# Patient Record
Sex: Male | Born: 1935 | Race: Black or African American | Hispanic: No | Marital: Single
Health system: Southern US, Community
[De-identification: ages and names within clinical notes are randomized; demographics above are authoritative.]

---

## 2011-01-14 ENCOUNTER — Emergency Department: Payer: Self-pay | Admitting: *Deleted

## 2012-04-02 ENCOUNTER — Inpatient Hospital Stay: Payer: Self-pay | Admitting: Internal Medicine

## 2012-04-02 LAB — BASIC METABOLIC PANEL
Anion Gap: 7 (ref 7–16)
BUN: 18 mg/dL (ref 7–18)
Calcium, Total: 8.8 mg/dL (ref 8.5–10.1)
Co2: 27 mmol/L (ref 21–32)
Creatinine: 1.06 mg/dL (ref 0.60–1.30)
EGFR (African American): >60
Glucose: 103 mg/dL — ABNORMAL HIGH (ref 65–99)
Osmolality: 280 (ref 275–301)
Potassium: 3.8 mmol/L (ref 3.5–5.1)
Sodium: 139 mmol/L (ref 136–145)

## 2012-04-02 LAB — TROPONIN I: Troponin-I: 0.06 ng/mL — ABNORMAL HIGH

## 2012-04-02 LAB — PRO B NATRIURETIC PEPTIDE: B-Type Natriuretic Peptide: 4097 pg/mL — ABNORMAL HIGH (ref 0–450)

## 2012-04-02 LAB — CBC
HGB: 14.9 g/dL (ref 13.0–18.0)
MCV: 98 fL (ref 80–100)
Platelet: 185 10*3/uL (ref 150–440)
RDW: 14.3 % (ref 11.5–14.5)

## 2012-04-03 LAB — TROPONIN I
Troponin-I: 0.09 ng/mL — ABNORMAL HIGH
Troponin-I: 0.09 ng/mL — ABNORMAL HIGH

## 2012-04-03 LAB — LIPID PANEL
Cholesterol: 161 mg/dL (ref 0–200)
HDL Cholesterol: 57 mg/dL (ref 40–60)
Ldl Cholesterol, Calc: 83 mg/dL (ref 0–100)
Triglycerides: 106 mg/dL (ref 0–200)

## 2012-04-03 LAB — TSH: Thyroid Stimulating Horm: 1.56 u[IU]/mL

## 2012-04-04 LAB — BASIC METABOLIC PANEL
Anion Gap: 7 (ref 7–16)
Calcium, Total: 8.3 mg/dL — ABNORMAL LOW (ref 8.5–10.1)
Co2: 31 mmol/L (ref 21–32)
EGFR (African American): >60
EGFR (Non-African Amer.): >60
Glucose: 90 mg/dL (ref 65–99)
Potassium: 3.6 mmol/L (ref 3.5–5.1)

## 2013-03-09 ENCOUNTER — Inpatient Hospital Stay: Payer: Self-pay | Admitting: Internal Medicine

## 2013-03-09 LAB — BASIC METABOLIC PANEL
Anion Gap: 7 (ref 7–16)
BUN: 16 mg/dL (ref 7–18)
CHLORIDE: 106 mmol/L (ref 98–107)
CO2: 30 mmol/L (ref 21–32)
Calcium, Total: 8.7 mg/dL (ref 8.5–10.1)
Creatinine: 1.28 mg/dL (ref 0.60–1.30)
EGFR (Non-African Amer.): 53 — ABNORMAL LOW
Glucose: 168 mg/dL — ABNORMAL HIGH (ref 65–99)
Osmolality: 290 (ref 275–301)
POTASSIUM: 3.1 mmol/L — AB (ref 3.5–5.1)
Sodium: 143 mmol/L (ref 136–145)

## 2013-03-09 LAB — PRO B NATRIURETIC PEPTIDE: B-Type Natriuretic Peptide: 9782 pg/mL — ABNORMAL HIGH (ref 0–450)

## 2013-03-09 LAB — CBC
HCT: 40.2 % (ref 40.0–52.0)
HGB: 12.8 g/dL — AB (ref 13.0–18.0)
MCH: 31.4 pg (ref 26.0–34.0)
MCHC: 31.7 g/dL — ABNORMAL LOW (ref 32.0–36.0)
MCV: 99 fL (ref 80–100)
PLATELETS: 148 10*3/uL — AB (ref 150–440)
RBC: 4.07 10*6/uL — ABNORMAL LOW (ref 4.40–5.90)
RDW: 16.6 % — AB (ref 11.5–14.5)
WBC: 5.9 10*3/uL (ref 3.8–10.6)

## 2013-03-09 LAB — TROPONIN I
TROPONIN-I: 0.11 ng/mL — AB
Troponin-I: 0.15 ng/mL — ABNORMAL HIGH

## 2013-03-10 LAB — BASIC METABOLIC PANEL
ANION GAP: 7 (ref 7–16)
BUN: 16 mg/dL (ref 7–18)
Calcium, Total: 8.3 mg/dL — ABNORMAL LOW (ref 8.5–10.1)
Chloride: 104 mmol/L (ref 98–107)
Co2: 33 mmol/L — ABNORMAL HIGH (ref 21–32)
Creatinine: 1.25 mg/dL (ref 0.60–1.30)
GFR CALC NON AF AMER: 55 — AB
Glucose: 101 mg/dL — ABNORMAL HIGH (ref 65–99)
OSMOLALITY: 288 (ref 275–301)
Potassium: 3.2 mmol/L — ABNORMAL LOW (ref 3.5–5.1)
Sodium: 144 mmol/L (ref 136–145)

## 2013-03-10 LAB — CBC WITH DIFFERENTIAL/PLATELET
BASOS ABS: 0 10*3/uL (ref 0.0–0.1)
BASOS PCT: 0.5 %
EOS PCT: 0.9 %
Eosinophil #: 0.1 10*3/uL (ref 0.0–0.7)
HCT: 40.2 % (ref 40.0–52.0)
HGB: 12.7 g/dL — AB (ref 13.0–18.0)
Lymphocyte #: 0.8 10*3/uL — ABNORMAL LOW (ref 1.0–3.6)
Lymphocyte %: 14.1 %
MCH: 31.2 pg (ref 26.0–34.0)
MCHC: 31.7 g/dL — ABNORMAL LOW (ref 32.0–36.0)
MCV: 99 fL (ref 80–100)
Monocyte #: 0.5 x10 3/mm (ref 0.2–1.0)
Monocyte %: 8.2 %
NEUTROS ABS: 4.5 10*3/uL (ref 1.4–6.5)
Neutrophil %: 76.3 %
Platelet: 152 10*3/uL (ref 150–440)
RBC: 4.08 10*6/uL — ABNORMAL LOW (ref 4.40–5.90)
RDW: 16.7 % — AB (ref 11.5–14.5)
WBC: 5.9 10*3/uL (ref 3.8–10.6)

## 2013-03-10 LAB — TROPONIN I: Troponin-I: 0.15 ng/mL — ABNORMAL HIGH

## 2013-03-10 LAB — LIPID PANEL
Cholesterol: 99 mg/dL (ref 0–200)
HDL Cholesterol: 49 mg/dL (ref 40–60)
LDL CHOLESTEROL, CALC: 36 mg/dL (ref 0–100)
Triglycerides: 72 mg/dL (ref 0–200)
VLDL CHOLESTEROL, CALC: 14 mg/dL (ref 5–40)

## 2013-03-10 LAB — OCCULT BLOOD X 1 CARD TO LAB, STOOL: OCCULT BLOOD, FECES: NEGATIVE

## 2013-03-10 LAB — HEMOGLOBIN A1C: HEMOGLOBIN A1C: 5.9 % (ref 4.2–6.3)

## 2013-03-10 LAB — TSH: Thyroid Stimulating Horm: 1.47 u[IU]/mL

## 2013-03-11 LAB — MAGNESIUM: Magnesium: 2 mg/dL

## 2013-03-11 LAB — POTASSIUM: Potassium: 3.4 mmol/L — ABNORMAL LOW (ref 3.5–5.1)

## 2013-08-27 ENCOUNTER — Inpatient Hospital Stay: Payer: Self-pay | Admitting: Internal Medicine

## 2013-08-27 LAB — CBC WITH DIFFERENTIAL/PLATELET
Basophil #: 0 10*3/uL (ref 0.0–0.1)
Basophil %: 0.2 %
Eosinophil #: 0 10*3/uL (ref 0.0–0.7)
Eosinophil %: 0.3 %
HCT: 49.2 % (ref 40.0–52.0)
HGB: 15.9 g/dL (ref 13.0–18.0)
Lymphocyte #: 0.8 10*3/uL — ABNORMAL LOW (ref 1.0–3.6)
Lymphocyte %: 6.6 %
MCH: 32.9 pg (ref 26.0–34.0)
MCHC: 32.3 g/dL (ref 32.0–36.0)
MCV: 102 fL — AB (ref 80–100)
MONO ABS: 0.6 x10 3/mm (ref 0.2–1.0)
MONOS PCT: 4.6 %
Neutrophil #: 11 10*3/uL — ABNORMAL HIGH (ref 1.4–6.5)
Neutrophil %: 88.3 %
Platelet: 167 10*3/uL (ref 150–440)
RBC: 4.82 10*6/uL (ref 4.40–5.90)
RDW: 13.6 % (ref 11.5–14.5)
WBC: 12.4 10*3/uL — ABNORMAL HIGH (ref 3.8–10.6)

## 2013-08-27 LAB — TSH: THYROID STIMULATING HORM: 1.81 u[IU]/mL

## 2013-08-27 LAB — URINALYSIS, COMPLETE
BILIRUBIN, UR: NEGATIVE
Glucose,UR: NEGATIVE mg/dL (ref 0–75)
KETONE: NEGATIVE
NITRITE: NEGATIVE
Ph: 6 (ref 4.5–8.0)
RBC,UR: 15 /HPF (ref 0–5)
Specific Gravity: 1.02 (ref 1.003–1.030)
Squamous Epithelial: NONE SEEN

## 2013-08-27 LAB — TROPONIN I: TROPONIN-I: 0.17 ng/mL — AB

## 2013-08-27 LAB — HEPATIC FUNCTION PANEL A (ARMC)
ALT: 42 U/L
Albumin: 3 g/dL — ABNORMAL LOW (ref 3.4–5.0)
Alkaline Phosphatase: 49 U/L
Bilirubin, Direct: 0.3 mg/dL — ABNORMAL HIGH (ref 0.00–0.20)
Bilirubin,Total: 1.3 mg/dL — ABNORMAL HIGH (ref 0.2–1.0)
SGOT(AST): 89 U/L — ABNORMAL HIGH (ref 15–37)
Total Protein: 7.1 g/dL (ref 6.4–8.2)

## 2013-08-27 LAB — BASIC METABOLIC PANEL
Anion Gap: 10 (ref 7–16)
BUN: 20 mg/dL — ABNORMAL HIGH (ref 7–18)
Calcium, Total: 8.8 mg/dL (ref 8.5–10.1)
Chloride: 110 mmol/L — ABNORMAL HIGH (ref 98–107)
Co2: 22 mmol/L (ref 21–32)
Creatinine: 0.99 mg/dL (ref 0.60–1.30)
EGFR (Non-African Amer.): >60
GLUCOSE: 99 mg/dL (ref 65–99)
Osmolality: 286 (ref 275–301)
Potassium: 5.5 mmol/L — ABNORMAL HIGH (ref 3.5–5.1)
Sodium: 142 mmol/L (ref 136–145)

## 2013-08-27 LAB — CK: CK, Total: 1285 U/L — ABNORMAL HIGH

## 2013-08-28 ENCOUNTER — Ambulatory Visit: Payer: Self-pay | Admitting: Neurology

## 2013-08-28 LAB — BASIC METABOLIC PANEL WITH GFR
Anion Gap: 8
BUN: 23 mg/dL — ABNORMAL HIGH
Calcium, Total: 8.6 mg/dL
Chloride: 111 mmol/L — ABNORMAL HIGH
Co2: 23 mmol/L
Creatinine: 1.29 mg/dL
EGFR (African American): >60
EGFR (Non-African Amer.): 53 — ABNORMAL LOW
Glucose: 78 mg/dL
Osmolality: 286
Potassium: 3.7 mmol/L
Sodium: 142 mmol/L

## 2013-08-28 LAB — CK: CK, Total: 715 U/L — ABNORMAL HIGH

## 2013-08-29 LAB — LIPID PANEL
CHOLESTEROL: 89 mg/dL (ref 0–200)
HDL Cholesterol: 43 mg/dL (ref 40–60)
Ldl Cholesterol, Calc: 35 mg/dL (ref 0–100)
TRIGLYCERIDES: 57 mg/dL (ref 0–200)
VLDL CHOLESTEROL, CALC: 11 mg/dL (ref 5–40)

## 2013-08-29 LAB — CK: CK, TOTAL: 281 U/L

## 2013-08-30 LAB — URINE CULTURE

## 2013-08-31 LAB — CBC WITH DIFFERENTIAL/PLATELET
Basophil #: 0 10*3/uL (ref 0.0–0.1)
Basophil %: 0.5 %
EOS PCT: 1.4 %
Eosinophil #: 0.1 10*3/uL (ref 0.0–0.7)
HCT: 41.7 % (ref 40.0–52.0)
HGB: 13.5 g/dL (ref 13.0–18.0)
Lymphocyte #: 0.8 10*3/uL — ABNORMAL LOW (ref 1.0–3.6)
Lymphocyte %: 12.9 %
MCH: 32.4 pg (ref 26.0–34.0)
MCHC: 32.2 g/dL (ref 32.0–36.0)
MCV: 101 fL — ABNORMAL HIGH (ref 80–100)
MONOS PCT: 5 %
Monocyte #: 0.3 x10 3/mm (ref 0.2–1.0)
Neutrophil #: 5.3 10*3/uL (ref 1.4–6.5)
Neutrophil %: 80.2 %
Platelet: 163 10*3/uL (ref 150–440)
RBC: 4.15 10*6/uL — ABNORMAL LOW (ref 4.40–5.90)
RDW: 13.6 % (ref 11.5–14.5)
WBC: 6.6 10*3/uL (ref 3.8–10.6)

## 2013-08-31 LAB — BASIC METABOLIC PANEL
Anion Gap: 9 (ref 7–16)
BUN: 24 mg/dL — ABNORMAL HIGH (ref 7–18)
CHLORIDE: 109 mmol/L — AB (ref 98–107)
CREATININE: 1.21 mg/dL (ref 0.60–1.30)
Calcium, Total: 7.7 mg/dL — ABNORMAL LOW (ref 8.5–10.1)
Co2: 24 mmol/L (ref 21–32)
EGFR (African American): >60
EGFR (Non-African Amer.): 57 — ABNORMAL LOW
Glucose: 125 mg/dL — ABNORMAL HIGH (ref 65–99)
OSMOLALITY: 289 (ref 275–301)
POTASSIUM: 3.7 mmol/L (ref 3.5–5.1)
Sodium: 142 mmol/L (ref 136–145)

## 2013-09-01 LAB — CULTURE, BLOOD (SINGLE)

## 2013-09-19 ENCOUNTER — Emergency Department: Payer: Self-pay | Admitting: Student

## 2013-09-19 LAB — URINALYSIS, COMPLETE
BACTERIA: NONE SEEN
BILIRUBIN, UR: NEGATIVE
Blood: NEGATIVE
Glucose,UR: NEGATIVE mg/dL (ref 0–75)
Ketone: NEGATIVE
LEUKOCYTE ESTERASE: NEGATIVE
NITRITE: NEGATIVE
PH: 6 (ref 4.5–8.0)
PROTEIN: NEGATIVE
Specific Gravity: 1.021 (ref 1.003–1.030)
Squamous Epithelial: 1
WBC UR: 1 /HPF (ref 0–5)

## 2013-09-19 LAB — PRO B NATRIURETIC PEPTIDE: B-TYPE NATIURETIC PEPTID: 7139 pg/mL — AB (ref 0–450)

## 2013-09-19 LAB — CBC WITH DIFFERENTIAL/PLATELET
BASOS PCT: 0.2 %
Basophil #: 0 10*3/uL (ref 0.0–0.1)
Eosinophil #: 0.4 10*3/uL (ref 0.0–0.7)
Eosinophil %: 6.7 %
HCT: 42.2 % (ref 40.0–52.0)
HGB: 13.7 g/dL (ref 13.0–18.0)
LYMPHS ABS: 0.8 10*3/uL — AB (ref 1.0–3.6)
Lymphocyte %: 14.1 %
MCH: 32.4 pg (ref 26.0–34.0)
MCHC: 32.4 g/dL (ref 32.0–36.0)
MCV: 100 fL (ref 80–100)
MONOS PCT: 6.6 %
Monocyte #: 0.4 x10 3/mm (ref 0.2–1.0)
Neutrophil #: 4.3 10*3/uL (ref 1.4–6.5)
Neutrophil %: 72.4 %
Platelet: 157 10*3/uL (ref 150–440)
RBC: 4.22 10*6/uL — AB (ref 4.40–5.90)
RDW: 14 % (ref 11.5–14.5)
WBC: 6 10*3/uL (ref 3.8–10.6)

## 2013-09-19 LAB — COMPREHENSIVE METABOLIC PANEL
ALK PHOS: 75 U/L
ALT: 30 U/L
Albumin: 3.3 g/dL — ABNORMAL LOW (ref 3.4–5.0)
Anion Gap: 7 (ref 7–16)
BUN: 18 mg/dL (ref 7–18)
Bilirubin,Total: 0.9 mg/dL (ref 0.2–1.0)
CALCIUM: 9.4 mg/dL (ref 8.5–10.1)
CHLORIDE: 108 mmol/L — AB (ref 98–107)
Co2: 26 mmol/L (ref 21–32)
Creatinine: 1.31 mg/dL — ABNORMAL HIGH (ref 0.60–1.30)
EGFR (African American): >60
GFR CALC NON AF AMER: 52 — AB
Glucose: 102 mg/dL — ABNORMAL HIGH (ref 65–99)
OSMOLALITY: 283 (ref 275–301)
POTASSIUM: 4 mmol/L (ref 3.5–5.1)
SGOT(AST): 31 U/L (ref 15–37)
Sodium: 141 mmol/L (ref 136–145)
TOTAL PROTEIN: 7.6 g/dL (ref 6.4–8.2)

## 2014-05-08 NOTE — Discharge Summary (Signed)
  DATE OF BIRTH:  July 05, 1933  DATE OF ADMISSION:  04/02/2012  DATE OF DISCHARGE:  04/04/2012  PRIMARY CARE PHYSICIAN:  Nonlocal CONSULTING PHYSICIAN:  Dr. Welton FlakesKhan, Cardiology  DISCHARGE DIAGNOSES: 1.  New onset acute congestive heart failure, diastolic dysfunction, with ejection fraction 40% to 45%.  2.  Accelerated hypertension.  3.  Elevated cardiac enzymes, likely due to demanding ischemia.   CONDITION:  Stable.   CODE STATUS:  FULL CODE.   HOME MEDICATIONS:  Lisinopril 10 mg p.o. daily. Atorvastatin 20 mg p.o. daily. Aspirin 81 mg p.o. daily. Coreg 6.25 mg p.o. b.i.d.  Lasix 40 mg p.o. daily.   DIET:  Low sodium, low fat, low cholesterol diet.   ACTIVITY:  As tolerated.   FOLLOWUP CARE:  Follow up with PCP within 1 to 2 weeks. Follow up with Dr. Welton FlakesKhan in 1 to 2 weeks.   REASON FOR ADMISSION:  Shortness of breath, cough, sputum, leg swelling for one week.   HOSPITAL COURSE: The patient is a 81 year old African-American male with no past medical history, who presented in the ED with shortness of breath, cough, sputum and leg edema for one week. For detailed history and physical examination, please refer to the admission note dictated by me. On the admission day, the patient's BNP was 4097, troponin was 0.06, creatinine 1.06, BUN 18, CBC normal. EKG shows normal sinus rhythm at 76 BPM. Chest x-ray showed pulmonary edema, possible pleural effusion. The patient was admitted for new onset acute congestive heart failure. The patient was admitted to the telemetry floor, was treated with oxygen, aspirin, Lipitor. Echocardiogram showed ejection fraction 40% to 45%. In addition, the patient was treated with IV Lasix 40 mg b.i.d., and was started on Coreg and lisinopril.  Dr. Welton FlakesKhan evaluated the patient, suggested the patient has a mild systolic dysfunction. He agreed with CHF treatment, and suggested followup as outpatient. The patient was clinically stable and was discharged to home on 04/04/2012. I  discussed the patient's discharge plan with the patient, case manager and Dr. Welton FlakesKhan.   TIME SPENT:  About 36 minutes.     ____________________________ Shaune PollackQing Marquan Vokes, MD qc:mr D: 04/06/2012 16:26:35 ET T: 04/06/2012 17:35:14 ET JOB#: [id number]  cc: Shaune PollackQing Cornelious Bartolucci, MD, <Dictator> Shaune PollackQING Mackenzy Eisenberg MD ELECTRONICALLY SIGNED 04/06/2012 18:22

## 2014-05-08 NOTE — H&P (Signed)
PATIENT NAME:  Edward Alexander, Edward Alexander MR#:  [id number] DATE OF BIRTH:  1933-04-30  DATE OF ADMISSION:  04/02/2012  PRIMARY CARE PHYSICIAN: Dr. Terance Hart.   REFERRING PHYSICIAN: Dr. Cyril Loosen   CHIEF COMPLAINT: Shortness of breath, cough, sputum and leg swelling for one week.   HISTORY OF PRESENT ILLNESS: The patient is a 81 year old African American male with no past medical history, presented to the ED with above chief complaint. The patient is alert, awake, oriented, in no acute distress. The patient said he got a cold 1 week ago and developed cough with sputum but the symptoms have been worsening. In addition, the patient developed shortness of breath, chest heaviness and leg swelling for the past 1 week. The patient has gained weight and has orthopnea but no nocturnal dyspnea. The patient denies any fever or chills. No abdominal pain, nausea, vomiting or diarrhea. The patient's chest x-ray showed pulmonary edema. BNP 4097.   PAST MEDICAL HISTORY: No.   PAST SURGICAL HISTORY: Right shoulder rotator surgery.   FAMILY HISTORY: Parents healthy, no medical problems.   SOCIAL HISTORY: No smoking or drinking or illicit drugs.   REVIEW OF SYSTEMS:  CONSTITUTIONAL: The patient denies any fever or chills. No headache or dizziness, but has generalized weakness and weight gain.  EYES: No double or blurred vision.  ENT: No postnasal drip, slurred speech or dysphagia.  CARDIOVASCULAR: No chest pain, but has chest heaviness, no palpitations, but has orthopnea and the leg edema.  PULMONARY: Positive for shortness of breath, cough, sputum, wheezing. No hemoptysis.  GASTROINTESTINAL: No abdominal pain, nausea, vomiting or diarrhea. No melena or bloody stool.   GENITOURINARY: No dysuria, hematuria, or incontinence but has less urine.  HEMATOLOGY: No easy bruising or bleeding.  NEUROLOGY: No syncope, loss of consciousness or seizure.  ENDOCRINE: No polyuria, polydipsia, heat or cold intolerance.   PHYSICAL  EXAMINATION: VITAL SIGNS: Blood pressure 170/114, pulse 94, respirations 24, oxygen saturation 95% with O2 by nasal cannula.  GENERAL: The patient is alert, awake, oriented, in no acute distress, has mild shortness of breath.  HEENT: Pupils round, equal, reactive to light and accommodation. Moist oral mucosa. Clear oropharynx.  NECK: Supple. No JVD or carotid bruits. No lymphadenopathy. No thyromegaly.  CARDIOVASCULAR: S1, S2 regular rate and rhythm. No murmurs, gallops.  PULMONARY: Bilateral air entry. Bilateral rales at the bases. No wheezing. No crackles. No use of accessory muscles to breathe.  ABDOMEN: Soft. No distention. No tenderness. No organomegaly. Bowel sounds are present.  EXTREMITIES: Bilateral leg edema 3+. No clubbing or cyanosis. No calf tenderness. Bilateral pedal pulses present.  SKIN: No rash or jaundice.  NEUROLOGIC: Alert and oriented x 3. No focal deficit. Power 5/5. Sensation intact. Deep tendon reflexes 2+.   LABORATORY, DIAGNOSTIC AND RADIOLOGIC DATA:  Troponin level was 0.06, CK 136, CK-MB 4.0. CBC normal. Glucose 103, BUN 18, creatinine 1.06, sodium 139, potassium 3.8, chloride 105. BNP 4097.  Chest x-ray showed pulmonary edema possible pleural effusion.   EKG showed sinus rhythm at 76 beats per minute.   IMPRESSION: 1.  Acute onset congestive heart failure.  2.  Hypertension emergency.  3.  Elevated troponin.   PLAN OF TREATMENT: 1.  The patient will be admitted to telemetry floor. We will continue oxygen by nasal cannula. Start aspirin 325 mg oral daily, Lipitor 20 mg p.o. daily and follow up troponin level and follow up echocardiogram and cardiology consult.  2.  We will start congestive heart failure protocol, start Lasix 40 mg IV twice a  day, start Lasix and Coreg.    3.  Hydralazine IV as needed for hypertension.  4.  Gastrointestinal and deep vein thrombosis prophylaxis. I discussed the patient's condition and plan of treatment with the patient.  CODE  STATUS:  The patient wants full code.   TIME SPENT: About 56 minutes    ____________________________ Shaune PollackQing Zadkiel Dragan, MD qc:cc D: 04/02/2012 20:11:03 ET T: 04/02/2012 20:55:04 ET JOB#: [id number]  cc: Shaune PollackQing Nyeema Want, MD, <Dictator> Shaune PollackQING Glendale Youngblood MD ELECTRONICALLY SIGNED 04/06/2012 18:04

## 2014-05-08 NOTE — Consult Note (Signed)
Brief Consult Note: Diagnosis: SOB.   Comments: Patient presneted with worsening SOB, cough, heaviness in chest, orthopnea, pedal edema and likely has acute CHF. Elevated TNI likely from demand ischemia and EKG with no acute changes and patient CP free at this time. Will get echo to check systolic, diastolic fx, and wall motion. Agree with BB, ACE-I, Lasix, supportive care at this time.  Electronic Signatures: Radene KneeKhan, Shaukat Ali (MD)   (Signed 19-Mar-14 09:44)  Co-Signer: Brief Consult Note Maia PlanManzi, Blaise Grieshaber A (PA-C)   (Signed 19-Mar-14 09:42)  Authored: Brief Consult Note  Last Updated: 19-Mar-14 09:44 by Radene KneeKhan, Shaukat Ali (MD)

## 2014-05-08 NOTE — Consult Note (Signed)
PATIENT NAME:  Edward Alexander, Edward Alexander MR#:  [id number] DATE OF BIRTH:  17-Aug-1933  DATE OF CONSULTATION:  04/03/2012  REFERRING PHYSICIAN:  Dr. Imogene Burn CONSULTING PHYSICIAN:  Verta Ellen, PA-C  PRIMARY CARE PHYSICIAN:  Dorothey Baseman, MD  REASON FOR CONSULTATION: Shortness of breath, congestive heart failure.   HISTORY OF PRESENT ILLNESS: Mr. Edward Alexander is a 81 year old African American male who is normally in very good health. Normally walks about 4 to 5 miles daily. He notes that last week after the cold weather and snow/ice he developed a runny nose, mild fever, shortness of breath and cough. The patient took Alka-Seltzer Cold, but his symptoms did not improve and his shortness of breath continued to worsen. He complains of a heaviness in his chest, coughing, orthopnea, PND and increased pedal edema. His heaviness in the chest did not radiate and was associated with his difficulty breathing. Denies any dizziness and headache. During his ED admission here, he had accelerated hypertension. There is no previous history of high blood pressure.   PAST MEDICAL HISTORY:  Negative.   PAST SURGICAL HISTORY:  Right shoulder surgery, rotator cuff repair.   ALLERGIES: No known drug allergies.    HOME MEDICATIONS: None.  FAMILY HISTORY: Brother with coronary artery disease, 2 sisters with diabetes mellitus.   SOCIAL HISTORY: The patient used to smoke cigars intermittently, which he does not do any longer, denies any alcohol, caffeine, or illicit drug use.  REVIEW OF SYSTEMS:   CONSTITUTIONAL: The patient had some low-grade fever last week. Denies any chills, headache or dizziness. EYES:  The patient denies double or blurred vision, he wears glasses.  ENT: The patient denies any postnasal drip sinus pain, slurred speech or dysphagia.  PULMONARY: The patient complains of shortness of breath, coughing (productive).  CARDIOVASCULAR: The patient has some chest heaviness, but denies any palpitations, leg edema.   GASTROENTEROLOGY: The patient denies any abdominal pain, nausea or diarrhea. NEUROLOGIC: The patient denies any syncope, loss of consciousness.   PHYSICAL EXAMINATION: GENERAL: This is a pleasant African American male who is not in any acute distress. He is alert and oriented x 3.  VITAL SIGNS: With a temperature of 97.5 degrees Fahrenheit, heart rate is 67, respiratory rate is 18, blood pressure 127/89, and O2 sat is 97% on room air.  HEENT: Head atraumatic, normocephalic.  EYES: Pupils are round and equal. He wears glasses. Conjunctivae are pale, pink. There is no scleral icterus. Ears and nose are normal to external inspection. Mouth with excellent dentition. Moist mucous membranes.  NECK: Supple. Trachea is midline. Thyroid is smooth and mobile.  LUNGS: No accessory muscle use. Bilateral crackles at the bases.   HEART:  Regular rate and rhythm. No murmurs, rubs or gallops.  ABDOMEN: Is nondistended. It is soft. Bowel sounds present in all 4 quadrants.  EXTREMITIES: The patient has 2 to 3+ leg edema bilaterally.   ANCILLARY DATA: Chest x-ray on admission with normal sinus rhythm at 92 beats per minute, possible left atrial enlargement, nonspecific ST changes.   LABORATORY DATA: Glucose 103. BNP 4097. BUN 18, creatinine 1.06, sodium 139, potassium 3.8, chloride 105 and estimated GFR is greater than 60. Total cholesterol is 161, triglycerides 106 and LDL is 83. Hemoglobin A1c is 5.7. Total CK is 136. Troponin I was 0.6 initially, followed by 0.09 x 2. TSH is 1.56. White blood cell count is 7.0, hemoglobin 14.9, hematocrit 45.5 and platelet count 185,000.   RADIOLOGICAL DATA: Chest x-ray: Pulmonary edema, bilateral interstitial thickening, heart size  is enlarged.   ASSESSMENT/PLAN: 1.  Hypertensive urgency. The patient's blood pressure is currently controlled on the current treatment regimen.  2.  Shortness of breath most likely consistent with acute onset congestive heart failure. The  patient's BNP is elevated and echocardiogram has been ordered. Agree with addition of Carvedilol, lisinopril and Lasix.  3.  Elevated troponin. The patient only has mild elevations in troponin, which is most likely due to demand ischemia due to his shortness of breath. The patient does not have any chest pain at this time and there are no acute EKG changes. Once his respiratory status improves, can consider outpatient stress testing.  Thank you very much for this consultation and allowing us to participate in this patient's care. ____________________________ Verta EllenMonica A. Balen Woolum, PA-C mam:sb D: 04/03/2012 10:03:01 ET T: 04/03/2012 10:57:24 ET JOB#: [id number]  cc: Verta EllenMonica A. Gerry Blanchfield, PA-C, <Dictator> Teena Iraniavid M. Terance HartBronstein, MD Leasha Goldberger A Amariya Liskey PA ELECTRONICALLY SIGNED 04/04/2012 10:02

## 2014-05-09 NOTE — Consult Note (Signed)
Brief Consult Note: Diagnosis: adjustment disorder.   Patient was seen by consultant.   Discussed with Attending MD.   Comments: PSychiatry: No new complaints. No behavior problems. Affect euthymic. Mood stated as fine. No evidence of thought disorder.  Continues to be able to articulate a plan for self care and understanding of situation. Not commitable. Has been off IVC. REc discharge home. SW involved. Dx Adjustment do.  Electronic Signatures: Audery Amellapacs, Uriel T (MD)  (Signed 07-Sep-15 12:49)  Authored: Brief Consult Note   Last Updated: 07-Sep-15 12:49 by Audery Amellapacs, Autry T (MD)

## 2014-05-09 NOTE — Consult Note (Signed)
Brief Consult Note: Diagnosis: dementia mixed eitiology.   Patient was seen by consultant.   Consult note dictated.   Discussed with Attending MD.   Comments: Psychiatry: Patient seen and chart reviewed and note dictated. Patient has moderate dementia but understands his situation and understands the need for taking his medication. Does not meet criteria for IVC. REc release from ER.  Electronic Signatures: Audery Amellapacs, Dayquan T (MD)  (Signed 05-Sep-15 15:04)  Authored: Brief Consult Note   Last Updated: 05-Sep-15 15:04 by Audery Amellapacs, Ossiel T (MD)

## 2014-05-09 NOTE — Consult Note (Signed)
PATIENT NAME:  Edward Alexander, Edward Alexander MR#:  [id number] DATE OF BIRTH:  [birth year]  DATE OF CONSULTATION:  09/20/2013  CONSULTING PHYSICIAN:  Audery Amel, MD  IDENTIFYING INFORMATION AND REASON FOR CONSULT:  This is a 81 year old man who was brought to the hospital under involuntary commitment, apparently initiated by a home health provider of some sort, out of concern that he could not take care of himself. Consult for evaluation of capacity to take care of himself.   HISTORY OF PRESENT ILLNESS:  Information obtained from the patient and the chart. The patient was at a rehabilitation facility up until a couple of days ago, probably on Friday. He says he has been there for a while and decided that he was feeling well enough that he could check himself out. He left the facility and got someone to drive him home. At home he was doing his laundry and cleaning things up around the house. He had not yet gone out to get his prescriptions filled. He then realizes that he had washed his car key in his pants pocket and was unable to turn his car on and so could not yet go and get the prescriptions filled. A home health person came by and discovered that he had not yet taken his medication and felt that this was evidence that he was not taking care of himself, and had him petitioned to the hospital. From what I am told his daughter, who lives in [place], has expressed an opinion that he needs to be placed in a nursing home. The patient himself states that he has no depression, denies any psychotic symptoms, feels perfectly capable of taking care of himself. Says he can ambulate around with a walker at home. He has close friends and neighbors who drive him to get his errands taken care of. He states that he knows that he needs to stay on his medication. No acute psychiatric symptoms.   PAST PSYCHIATRIC HISTORY:  The patient denies any past psychiatric history. No history of major depression, psychotic disorder. Denies a history  of significant substance abuse. No hospitalization psychiatrically. No history of suicide attempts.   PAST MEDICAL HISTORY:  The patient had a recent stroke for which he was here at our hospital in August. He was discharged to a rehabilitation facility and has been there until he left on Friday. Also has congestive heart failure and dyslipidemia. He has had other strokes in the past. They have left him with some weakness, especially on the left side.   CURRENT MEDICATIONS: Atorvastatin 20 mg a day, Aricept 5 mg a day, aspirin 81 mg a day, Coreg 3.125 mg twice a day, Lasix 40 mg a day, potassium chloride 20 mEq a day.   FAMILY HISTORY: No known family history of mental illness.   SOCIAL HISTORY: Lives alone. Has been alone for many years. Closest relative appears to be his daughter, who lives in [place]. He has, by his account, several friends in the area who are available to take him around and drive him places.   REVIEW OF SYSTEMS: He complains that he has chronic weakness on his left side. Mood is feeling stable. Denies depression. Denies suicidal or homicidal ideation. Denies any hallucinations. Not complaining of any shortness of breath, cardiac or GI complaints. Full review of systems negative.   MENTAL STATUS EXAMINATION: This is an elderly gentleman, interviewed in a hospital bed. He was awake when I came in. Cooperative with the interview. Eye contact reasonably good. Psychomotor activity  a little sluggish. Speech slow but easy to understand. Affect reactive, slightly constricted. Mood stated as being fine. Thoughts appeared to be slow but lucid. No evidence of delusions, did not make any bizarre statements, did not appear to be confused. Stayed on topic with the whole conversation. Denied hallucinations; denied suicidal or homicidal ideation. He scored a 21 on the Mini-Mental Status exam which is evidence of some degree of dementia. I would interject that part of his difficulty, however, might  come from the residual effects of his stroke, especially on his writing. Nevertheless, he clearly has memory impairment. He was alert and oriented x4; however, knew the date and the situation and understood his position perfectly. He was able to describe for me precautions he takes at home in case he gets sick so that he can get help immediately. He was able to describe the risks of future strokes and the reason to stay on his medicine.   LABORATORY RESULTS:  Chemistry panel: Elevated creatinine 1.3, chloride slightly elevated at 108. CBC largely unremarkable.   Urinalysis unremarkable.   ASSESSMENT: This is a 81 year old man who came into the hospital because of concerns that he was unsafe to take care of himself at home. I do not see that any previous concerns of that sort had been raised. Today, the patient is able to describe appropriate measures he takes to take care of himself at home. He denies any suicidality. He is completely agreeable to staying on his medication. There is no past psychiatric history. He certainly does have some degree of dementia, but at this point, I do not think she meets commitment criteria nor would I say that he obviously lacks capacity.   TREATMENT PLAN: The patient reminded that he needs to stay on his medicine, keep his medical appointments and that he should strongly consider getting some help taking care of his chores around the house, especially while still recovering. He is completely agreeable to all of that. No further psychiatric treatment.   DIAGNOSIS, PRINCIPAL AND PRIMARY:  AXIS I: Dementia, mixed type, vascular and Alzheimer dementia, mild to moderate.   SECONDARY DIAGNOSES:  AXIS I: No further.  AXIS II: None.  AXIS III: Recent stroke, history of congestive heart failure.    ____________________________ Audery AmelJohn T. Sandford Diop, MD jtc:lt D: 09/21/2013 14:34:17 ET T: 09/21/2013 14:57:51 ET JOB#: [id number]  cc: Audery AmelJohn T. Napoleon Monacelli, MD, <Dictator> Audery AmelJOHN T  Hilmer Aliberti MD ELECTRONICALLY SIGNED 09/26/2013 17:01

## 2014-05-09 NOTE — Consult Note (Signed)
PATIENT NAME:  Edward Alexander, Edward Alexander MR#:  [id number] DATE OF BIRTH:  May 19, 1933  DATE OF CONSULTATION:  08/28/2013  REFERRING PHYSICIAN:   CONSULTING PHYSICIAN:  Pauletta BrownsYuriy Bronco Mcgrory, MD  REASON FOR CONSULTATION: Stroke.   HISTORY OF PRESENT ILLNESS: This is a 81 year old African American male with past medical history of congestive heart failure, hypertension, status post being found on the floor. When found on the floor, the patient was noted to have a left-sided weakness in his left upper extremity and more so in his left lower extremity. On admission the patient was found to be in rhabdomyolysis, found to have urinary tract infection, and elevated troponin and likely troponin leak. The patient is status post MRI and carotid Dopplers. Carotid Dopplers, no hemodynamic stenosis. On MRI, the patient was found to have bilateral patchy MCA infarcts, right more so than left suggesting probably cardioembolic in nature.   PAST MEDICAL HISTORY: CHF, hyperlipidemia, hypertension.   PAST SURGICAL HISTORY: Right arm rotator cuff surgery.   ALLERGIES: No known drug allergies.  HOME MEDICATIONS: Lisinopril and including atorvastatin, aspirin 81, Coreg, and Lasix.   SOCIAL HISTORY: No smoking. No drug use. No EtOH use.   FAMILY HISTORY: Father died of COPD. Mother died of previous history of stroke.   REVIEW OF SYSTEMS:  CONSTITUTIONAL: Generalized fatigue.  HEENT: No tinnitus, ear pain. No cough. No wheeze. No hemoptysis.  CARDIOVASCULAR: No chest pain, orthopnea.  GENITOURINARY: No dysuria or hematuria.  ENDOCRINE: No polyuria or nocturia.  MUSCULOSKELETAL: No back pain. Arthritis.   IMAGING: As described above.   FLOW SHEET: Include a temperature of 97.5, pulse 70, respirations 18, blood pressure 146/96, pulse oximetry 97%.   NEUROLOGIC: The patient is awake, alert and oriented to time, place, location, and the reason why he is in the hospital. No facial droop is noted. Tongue is midline. Uvula elevates  symmetrically. Extraocular movements are intact. In terms of motor strength examination, the patient is 4+/5 left upper extremity with minor pronator drift, and the patient is 4-/5 left lower extremity. Right upper and right lower extremity is 5/5. Sensation is slightly diminished in the left lower extremity. Reflexes diminished throughout. Coordination: Finger-to-nose intact but slow on the left side due to left upper extremity weakness.   IMPRESSION: This is a 81 year old male with a history of congestive heart failure, hypertension, status post being found on the floor by his neighbor, found to have rhabdomyolysis a urinary tract infection, and on MRI the patient has bilateral patchy infarcts, right more than left, suspected to be cardiac embolic in nature.   PLAN: Awaiting results of 2-D echocardiogram. If that is not helpful and there are no signs of thrombus, would strongly encourage obtaining TEE to have a better picture of the heart because these strokes do appear cardioembolic in nature. At this point, would continue antiplatelet therapy. Please obtain CT angiogram of the head to have a better picture of intracranial vessels. Physical therapy, occupational therapy and n.p.o. after midnight for possibly TEE.   Thank you. It was a pleasure seeing this patient. We will follow up.    ____________________________ Pauletta BrownsYuriy Avaline Stillson, MD yz:NT D: 08/28/2013 13:09:01 ET T: 08/28/2013 13:32:43 ET JOB#: [id number]  cc: Pauletta BrownsYuriy Aerabella Galasso, MD, <Dictator> Pauletta BrownsYURIY Alpha Mysliwiec MD ELECTRONICALLY SIGNED 09/19/2013 16:05

## 2014-05-09 NOTE — Discharge Summary (Signed)
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   ADDITIONAL DIAGNOSES: 1.  Urinary tract infection.  2.  Rhabdomyolysis.  3.  Hypertension.  4.  History of congestive heart failure with preserved ejection fraction of 50% to 60%.  5.  Mild mitral regurgitation, mild tricuspid regurgitation.   CONSULTATIONS:  1.  Neurology, Pauletta BrownsYuriy Zeylikman, MD  2.  Cardiology, Harold HedgeKenneth Fath, MD  PROCEDURES:  1.  CT of the head without contrast August 12 showed small area of subacute infarct involving the posterior right temporal lobe. Atrophy and chronic microvascular change.  2.  CT of the chest without contrast shows small bilateral pleural effusions with a subpulmonic component on the left corresponding to the density on chest x-ray, also bibasilar atelectasis, cardiomegaly and coronary artery disease. 3.  CT angiography of the head with contrast 08/28/2013 showed no major intracranial arterial occlusion or significant proximal stenosis. Patchy cortical and white matter hypodensities in both cerebral hemispheres corresponding ischemic changes.  4.  MRI of the brain without contrast shows: Patchy acute to early subacute infarct in the right greater than left MCA territories. Additionally, late subacute and chronic bilateral MCA infarcts as above. Moderate chronic small vessel ischemic disease. Remote hemorrhage in the [place]. MRI performed on 08/28/2013.  5.  Carotid Dopplers bilateral performed 08/28/2013 show no evidence of focal hemodynamically significant stenosis.  6.  A 2-D echocardiogram performed on 08/28/2013 shows preserved left ejection fraction 50% to 55%. Low-normal global ventricular systolic function. Severe concentric left ventricular hypertrophy. Severely increased left ventricular septal thickness. Mildly  dilated right atrium. Mild mitral valve regurgitation. Moderate tricuspid valve regurgitation. Mildly elevated pulmonary artery systolic pressure.  7.  Transesophageal echocardiogram performed 08/29/2013 shows left ventricular ejection fraction 50% to 60%. Mild mitral valve regurgitation. Mild thickening of the anterior and posterior mitral valve leaflets. Saline contrast bubble study negative. Mild tricuspid regurgitation.   HISTORY OF PRESENT ILLNESS: This is very pleasant 81 year old African American man presents to the Emergency Room after a neighbor found him on the floor. The patient mentions that Sunday morning when he was walking  he had sudden onset of left-sided  weakness and he had to go down to the floor. He did not fall, but he was unable to get up and had been lying on the floor until his neighbor found him. In the Emergency Room, he was found to have rhabdomyolysis, acute right temporal CVA, urinary tract infection, mildly elevated troponin.   HOSPITAL COURSE AND TREATMENT:  1.  Acute, subacute and chronic CVAs bilaterally, likely embolic: No arrhythmia seen on telemetry. Carotid Dopplers are normal. TTE and TEE show no embolic source. Neurology followed this patient in the hospital. At this point, he continues on aspirin 81 mg daily to prevent further events. He will need a Holter monitor after discharge to observe for possible cardiac arrhythmias. He will continue on a statin. Lipid panel in hospital is excellent.  2.  Left-sided weakness: The patient remains fairly weak on the left side. He will continue with PT/OT in an inpatient setting.  3.  Urinary tract infection: Cultures grew Escherichia faecalis. He has been treated with 5 days of IV Rocephin. He has completed his course of treatment.  4.  Rhabdomyolysis with initial CK of 1285, down to 281 at the time of discharge. Renal function is preserved.  5.  Hypertension: Blood pressure has been well controlled in hospital on low-dose  Coreg only. We have discontinued his lisinopril and Lasix during hospitalization due to low blood pressures. This should be monitored at his next facility and antihypertensives adjusted as needed.  6.  History of congestive heart failure with preserved ejection fraction of fraction of 55% to 60% seen on TTE in hospital. He does have thickened left ventricular walls and likely has diastolic dysfunction. There was no sign of volume overload or congestive heart failure during this hospitalization. Note that his ACE inhibitor and diuretic are held at this time. He should be monitored closely for any signs of volume overload.  7.  Mild mitral regurgitation and tricuspid regurgitation as seen on TTE and TEE during hospitalization.   DISCHARGE EXAMINATION: VITAL SIGNS: Temperature 98.2. Blood pressure 130/83, pulse 64, respirations 18, oxygen saturation 97% on room air.  GENERAL: The patient is alert, oriented, in no distress.  HEENT: Pupils are equal, round and reactive to light. Hearing intact. Oral mucosa is pink and moist, good dentition.  NECK: Supple with no mass.  RESPIRATORY: Normal respiratory effort, clear breath sounds, no respiratory distress, good air movement.  CARDIOVASCULAR: Regular rate and rhythm. No murmurs, no edema, no JVD.  ABDOMEN: No tenderness, abdomen soft, bowel sounds are positive. No guarding, no rebound.  SKIN: Normal to palpation. No rash, no wound.  NEUROLOGIC: There is a left-sided facial droop, mild with no dysarthria, left lower extremity weakness is 4/5, sensation is intact. Left upper extremity weakness 4/5, sensation intact. Strength is normal on the right side.  PSYCHIATRIC: The patient is alert and oriented. He has good insight into his condition. He shows no signs of depression or anxiety at this time.   LABORATORY DATA: Day of discharge: Sodium 142, potassium 3.7, chloride 109, bicarb 24, BUN 24, creatinine 1.2, glucose 125, calcium 7.7. White blood cell count 6.6,  hemoglobin 13.5, platelets 163,000, MCV 101.   CONDITION ON DISCHARGE: Stable. The patient is being discharged to skilled nursing for continued physical therapy and occupational therapy.   DISCHARGE MEDICATIONS:  1.  Aspirin 81 mg daily.  2.  Simvastatin 40 mg 1 tablet daily.  3.  Carvedilol 3.125 mg 1 tablet twice a day.  4.  Acetaminophen 325 mg 2 tablets every 4 hours as needed for pain or fever.   DISCHARGE INSTRUCTIONS: The patient will be evaluated and treated by physical and occupational therapy in his new setting. Diet should be low sodium and heart healthy. Activity per physical therapy.   FOLLOWUP: This patient needs a Holter monitor to evaluate for arrhythmia as a possible cause of embolic stroke. He needs a followup appointment with cardiology within 1-2 weeks of discharge.   ____________________________ Ena Dawley. Clent Ridges, MD cpw:TT D: 08/31/2013 13:08:29 ET T: 08/31/2013 14:21:49 ET JOB#: [id number]  cc: Santina Evans P. Clent Ridges, MD, <Dictator> Gale Journey MD ELECTRONICALLY SIGNED 09/01/2013 16:50

## 2014-05-09 NOTE — H&P (Signed)
PATIENT NAME:  Edward Alexander, Edward Alexander MR#:  [id number] DATE OF BIRTH:  05/27/1933  DATE OF ADMISSION:  03/09/2013  PRIMARY CARE PHYSICIAN: Dr. Terance Hart.   CARDIOLOGIST: Last seen by Dr. Freda Munro.   CHIEF COMPLAINT: Shortness of breath and edema.   HISTORY OF PRESENT ILLNESS: This is a 81 year old man with history of systolic congestive heart failure. He presents with 3 weeks of worsening swelling of his legs, penis and testicles. He does have shortness of breath on walking. He has gained 25 to 30 pounds over a short period of time from his waist down. In the ER, he was found to be in congestive heart failure with an elevated BNP, chest x-ray showing heart failure and troponin that is borderline at 0.11. Hospitalist services were contacted for further evaluation. The patient complains of no chest pain.   PAST MEDICAL HISTORY: Acute systolic congestive heart failure, edema, hyperlipidemia, hypertension.   PAST SURGICAL HISTORY: Right arm torn rotator cuff.   ALLERGIES: No known drug allergies   MEDICATIONS: Include lisinopril 10 mg daily, atorvastatin 20 mg at bedtime, aspirin 81 mg daily, Coreg 6.25 mg twice a day, Lasix 40 mg b.i.d., garlic daily, vitamin E daily, fish oil daily, cod liver oil daily.   SOCIAL HISTORY: No smoking since 1984. No alcohol since 1984. No drug use. Used to work as a Naval architect.   FAMILY HISTORY: Father died of COPD. Mother died of a stroke.   REVIEW OF SYSTEMS:   CONSTITUTIONAL: No fever, chills or sweats. Positive for weight gain. No weakness.  EYES: He does wear glasses.  EARS, NOSE, MOUTH AND THROAT: Positive for runny nose. No sore throat. No difficulty swallowing.  CARDIOVASCULAR: No chest pain. No palpitations.  RESPIRATORY: Positive for shortness of breath. Positive for cough. No sputum. No hemoptysis.  GASTROINTESTINAL: No nausea. No vomiting. No abdominal pain. No diarrhea. No constipation. No bright red blood per rectum. No melena.  GENITOURINARY: No  burning on urination. No hematuria.  MUSCULOSKELETAL: No joint pain or muscle pain.  INTEGUMENT: No rashes or eruptions.  NEUROLOGIC: No fainting or blackouts.  PSYCHIATRIC: No anxiety or depression.  ENDOCRINE: No thyroid problems.  HEMATOLOGIC AND LYMPHATIC: No anemia. No easy bruising or bleeding.   PHYSICAL EXAMINATION:  VITAL SIGNS: Temperature 97.4, pulse 83, respirations 20, blood pressure 142/90, pulse ox 97% on room air.  GENERAL: No respiratory distress.  EYES: Conjunctivae and lids normal. Pupils equal, round and reactive to light. Extraocular muscles intact. No nystagmus.  EARS, NOSE, MOUTH AND THROAT: Tympanic membranes: No erythema. Nasal mucosa: No erythema. Throat: No erythema. No exudate seen. Lips and gums: No lesions.  NECK: Positive JVD. No bruits. No lymphadenopathy. No thyromegaly. No thyroid nodules palpated.  RESPIRATORY: Decreased breath sounds bilaterally. Positive rales halfway up lung field.  CARDIOVASCULAR: S1, S2 normal. Positive 2/6 systolic ejection murmur. Carotid upstroke 2+ bilaterally. No bruits. Dorsalis pedis pulses difficult to palpate secondary to 4+ edema.  ABDOMEN: Soft, nontender. No organo- or splenomegaly. Normoactive bowel sounds. No masses felt.  GENITOURINARY: Swollen penis and scrotum and testicles 2+.  MUSCULOSKELETAL: Edema 4+ of lower extremities. No clubbing. No cyanosis.  SKIN: No ulcers or lesions seen.  NEUROLOGIC: Cranial nerves II through XII grossly intact. Deep tendon reflexes 1+ bilateral lower extremities.  PSYCHIATRIC: The patient is oriented to person, place, and time.   LABORATORY AND RADIOLOGICAL DATA: BNP 9782. Glucose 168, BUN 16, creatinine 1.28, sodium 143, potassium 3.1, chloride 106, CO2 30, calcium 8.7. Troponin borderline at 0.11. White blood  cell count 5.9, H and H 12.8 and 40.2, platelet count of 148. Chest x-ray shows moderate congestive heart failure. EKG: Normal sinus rhythm. Left axis deviation. Right bundle branch  block. Inferior infarct and anterior infarct   ASSESSMENT AND PLAN:  1. Acute systolic congestive heart failure with anasarca: Emergency Room physician ordered Lasix 80 mg intravenous x 1. Will switch Lasix to 60 mg intravenous b.i.d. and continue to monitor. The patient is on Coreg and lisinopril, also. Will check an echocardiogram and continue to monitor daily weights and ins and outs. Need to monitor kidney function closely with diuresis.  2. Hypertension: Blood pressure borderline. Will continue on usual medications. Add nitroglycerin patch, also.  3. Hyperlipidemia: Continue Lipitor. Check a lipid profile in the a.m.  4. Anemia: Will guaiac stools and continue to monitor.  5. Hypokalemia: Will give a stat dose of 40 mEq p.o. potassium and then 20 mEq b.i.d.  6. Elevated troponin: Likely secondary to renal insufficiency and demand ischemia with heart failure.  7. Impaired fasting glucose: Will check a hemoglobin A1c in the a.m. Will also put on sliding scale.   TIME SPENT ON ADMISSION: 50 minutes.   ____________________________ Herschell Dimesichard J. Renae GlossWieting, MD rjw:gb D: 03/09/2013 21:28:17 ET T: 03/09/2013 22:55:37 ET JOB#: [id number]  cc: Herschell Dimesichard J. Renae GlossWieting, MD, <Dictator> Teena Iraniavid M. Terance HartBronstein, MD Yevonne PaxSaadat A. Khan, MD Salley ScarletICHARD J Adley Mazurowski MD ELECTRONICALLY SIGNED 03/10/2013 13:32

## 2014-05-09 NOTE — Discharge Summary (Signed)
PATIENT NAME:  Edward Alexander, [place] MR#:  [id number] DATE OF BIRTH:  01-08-1934  DATE OF ADMISSION:  03/09/2013 DATE OF DISCHARGE:  03/11/2013  DISCHARGE DIAGNOSES:  Acute systolic heart failure with anasarca, now improving, with negative 7.5 liters of fluid balance since admission feeling much better. Echocardiogram showing ejection fraction of 45% to 50%.   SECONDARY DIAGNOSES: 1.  Systolic congestive heart failure.  2.  Hypertension.  3.  Hyperlipidemia.   CONSULTATION: Physical therapy.   PROCEDURES AND RADIOLOGY: 1.  Chest x-ray on the 22nd of February showed moderate congestive heart failure.  2.  A 2-D echocardiogram on the 23rd of February showed left ventricular ejection fraction of 45% to 50%. Elevated left atrial and ventricular end-diastolic pressure. Moderately enlarged right ventricle. Mildly dilated left atrium. Moderately dilated right atrium. Pericardial effusion present. It is trivial in size. Mild mitral valve regurgitation. Mildly elevated pulmonary artery systolic pressure. Mild to moderate tricuspid regurgitation. Severely increased left ventricular posterior wall thickness. Left moderate pleural effusion.   HISTORY AND SHORT HOSPITAL COURSE:  The patient is a 81 year old male with above-mentioned medical problems was admitted for acute on chronic systolic heart failure. Please see Dr. Mathews RobinsonsWieting's dictated history and physical for further details. The patient was started on IV diuretics, was feeling much better. By the 24th of February, he had about 7.5 liters of fluid removed and had a negative fluid balance, was feeling close to his baseline and was discharged home in stable condition.   PHYSICAL EXAMINATION: VITAL SIGNS: On the date of discharge, his vital signs are as follows: Temperature 97.3, heart rate 67 per minute, respirations 18 per minute, blood pressure 116/74 mmHg. He was saturating 95% on room air.  PERTINENT PHYSICAL EXAMINATION: On the date of discharge:   CARDIOVASCULAR: S1, S2 normal. No murmurs, rubs, gallops.  LUNGS: Clear to auscultation bilaterally. No wheezing, rales, rhonchi or crepitation.  ABDOMEN: Soft, benign.  NEUROLOGIC: Nonfocal examination.  All other physical examination remained at baseline.   DISCHARGE MEDICATIONS: 1.  Lisinopril 10 mg p.o. daily.  2.  Coreg 6.25 mg p.o. b.i.d. 3.  Simvastatin 40 mg p.o. at bedtime.  4.  Aspirin 81 mg p.o. daily. 5.  Potassium chloride 20 mEq p.o. b.i.d.  6.  Lasix 40 mg p.o. daily as needed and 40 mg p.o. b.i.d.   DISCHARGE DIET: Low sodium, low fat, low cholesterol.   DISCHARGE ACTIVITY: As tolerated.  DISCHARGE INSTRUCTIONS AND FOLLOWUP: The patient was instructed to follow up with his primary care physician, Dr. Dorothey Basemanavid Bronstein, in 2 to 4 weeks. He will need followup with cardiology, Dr. Adrian BlackwaterShaukat Khan, in 1 to 2 weeks.   TOTAL TIME DISCHARGING THIS PATIENT: 55 minutes.   ____________________________ Ellamae SiaVipul S. Sherryll BurgerShah, MD vss:ce D: 03/15/2013 16:54:34 ET T: 03/15/2013 18:12:21 ET JOB#: [id number]  cc: Denvil Canning S. Sherryll BurgerShah, MD, <Dictator> Teena Iraniavid M. Terance HartBronstein, MD Laurier NancyShaukat A. Khan, MD Ellamae SiaVIPUL S [facility] MD ELECTRONICALLY SIGNED 03/19/2013 21:23

## 2014-05-09 NOTE — H&P (Signed)
PATIENT NAME:  Edward Alexander, [name] MR#:  [id number] DATE OF BIRTH:  Oct 23, 1933  DATE OF ADMISSION:  08/27/2013  PRIMARY CARE PROVIDER: Dr. Terance HartBronstein.   CARDIOLOGIST: Dr. Adrian BlackwaterShaukat Khan.   CHIEF COMPLAINT: Left-sided weakness.   HISTORY OF PRESENTING ILLNESS: A 81 year old African American male patient with history of systolic congestive heart failure, hypertension, presented to the Emergency Room after one of his neighbors found him on the floor. The patient mentioned that Sunday morning he was walking, suddenly had worsening left-sided weakness, and he had to go down onto the floor, did not fall, but was unable to get up and has been lying on the floor, since. His neighbor who came across to check on him, found him and brought him to the Emergency Room. Here, he has been found to have some rhabdomyolysis, acute right temporal CVA, UTI, mildly elevated troponin and is being admitted to the hospitalist service.   He does not have any problems in sensation, no problems with speech, swallowing. He has not taken medication since Sunday. Mentions that he has not eaten or had any fluids since then.   PAST MEDICAL HISTORY:  1. Acute systolic congestive heart failure.  2. Hyperlipidemia.  3. Hypertension.   PAST SURGICAL HISTORY: Right arm rotator cuff surgery.   ALLERGIES: No drug allergies.   HOME MEDICATIONS:  1. Lisinopril 10 mg daily. 2. Atorvastatin 20 mg daily.  3. Aspirin 81 mg daily. 4. Coreg 6.25 mg twice daily. 5. Lasix 40 mg 2 times a day.   SOCIAL HISTORY: The patient quit smoking in 1984 along with alcohol. No drug use. Used to work as a Naval architecttruck driver, presently retired. Walks on his own. Lives alone.   FAMILY HISTORY: Father died of COPD. Mother died of a stroke.   REVIEW OF SYSTEMS:  CONSTITUTIONAL: Complains of significant fatigue and weakness.  EYES: No blurred vision, pain.  ENT: No tinnitus, ear pain, hearing loss.  RESPIRATORY: No cough, wheeze, hemoptysis.  CARDIOVASCULAR: No  chest pain, orthopnea, edema.  GASTROINTESTINAL: No nausea, vomiting, diarrhea, abdominal pain.  GENITOURINARY: No dysuria, hematuria, or frequency.  ENDOCRINE: No polyuria, nocturia, or thyroid problems. HEMATOLOGIC AND LYMPHATIC: No anemia, easy bruising, bleeding.  INTEGUMENTARY: No acne, rash, lesion.  MUSCULOSKELETAL: No back pain, arthritis.  NEUROLOGIC: Has left-sided weakness.  PSYCHIATRIC: No anxiety or depression.   PHYSICAL EXAMINATION:  VITAL SIGNS: Temperature 97.6, pulse 72, blood pressure 196/93, saturating 100% on room air.  GENERAL: Moderately built [place] male patient lying in bed, overall seems comfortable.  PSYCHIATRIC: Alert and oriented x 3. Mood and affect appropriate. Judgment intact.  HEENT: Atraumatic, normocephalic. Oral mucosa moist and pink. External ears are normal. No pallor. No icterus. Pupils equal reactive to light.  NECK: Supple. No thyromegaly or palpable lymph nodes. Trachea midline. No carotid bruit, JVD.  CARDIOVASCULAR: S1, S2, without any murmurs. Peripheral pulses 2+. No edema.  RESPIRATORY: Normal work of breathing. Clear to auscultation both sides.  GASTROINTESTINAL: Soft abdomen, nontender. Bowel sounds present. No hepatosplenomegaly palpable. SKIN: Warm and dry. No petechiae, rash, ulcers.  MUSCULOSKELETAL: No joint swelling, redness in large joints. Normal muscle tone.  NEUROLOGICAL: Motor strength 5/5 in right upper and lower extremity and 4/5 in the left upper and lower extremity. Sensation is intact all over. Cranial nerves II through XII intact.  LYMPHATIC: No cervical lymphadenopathy.   LABORATORY STUDIES: Show glucose of 99, BUN 20, creatinine 0.99, sodium 142, potassium 4.5, chloride 110, bicarbonate 22 with AST, ALT, alkaline phosphatase normal with a CK of  1285, troponin 0.17. TSH of 1.81.   WBC 12.4, hemoglobin 15.9, platelets of 167,000.   Urinalysis shows 2+ bacteria and 49 WBC. Hazy clarity.   EKG shows normal sinus  rhythm with right bundle branch block, poor R wave progression.   Chest x-ray PA and lateral, showed some left lung base opacity concerning for infiltrate versus effusion, which was followed by a CT of the chest which showed small bilateral pleural effusions with atelectasis. No infiltrates. No mass.   CT scan of the head without contrast shows acute right temporal CVA measuring 2.6 cm x 2 cm, which is a subacute CVA.   ASSESSMENT AND PLAN:  1. Subacute right temporal cerebrovascular accident with left-sided weakness. We will admit patient on the tele floor, get echo and Dopplers along with an MRI of the brain. Start him on aspirin, statin. The patient will be on fall precautions. We will consult PT in discharge planning.  2. Rhabdomyolysis secondary to lying on the floor, presently CK is at 1200. We will repeat a CK in the morning to follow up, put him on IV fluids.  3. Chronic systolic congestive heart failure. No signs of fluid overload at this time. The patient will be on IV fluids, Lasix held, monitor for any signs of fluid overload.  4. Hypertension: Hold antihypertensive medications for permissive hypertension.  5. Deep vein thrombosis prophylaxis with Lovenox.  6. Code status: Full code.  7. Hypokalemia secondary to dehydration. Should improve with IV fluids. Repeat in the morning.   TIME SPENT TODAY ON THIS CASE: Was 50 minutes.     ____________________________ Molinda Bailiff Laval Cafaro, MD srs:lt D: 08/27/2013 19:02:56 ET T: 08/27/2013 19:58:40 ET JOB#: [id number]  cc: Wardell Heath R. Dalanie Kisner, MD, <Dictator> Teena Irani. Terance Hart, MD Laurier Nancy, MD Orie Fisherman MD ELECTRONICALLY SIGNED 09/01/2013 14:14

## 2015-03-17 DEATH — deceased

## 2016-07-28 IMAGING — CT CT HEAD WITHOUT CONTRAST
1 series · 15 of 29 positions shown, 19 images · non-contrast
Comparison: None.

CLINICAL DATA: Left-sided weakness.

EXAM:
CT HEAD WITHOUT CONTRAST
TECHNIQUE: Contiguous axial images were obtained from the base of the skull
through the vertex without intravenous contrast.

[Series 2: head wo · axial · 0.39mm/px · z∈[-138,-8]mm · 15 of 29 slices shown, 19 images]
[im 2/29  brain]
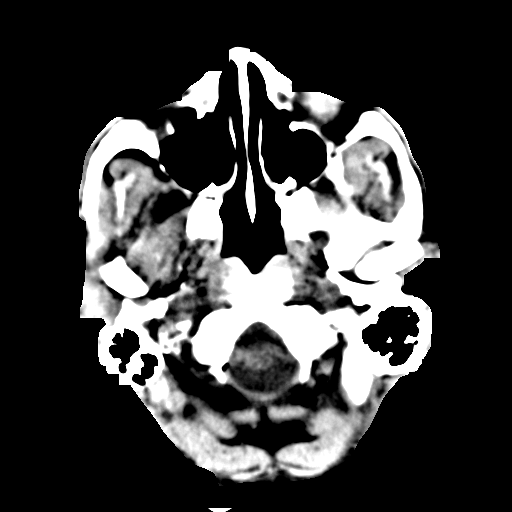
[im 2/29  bone]
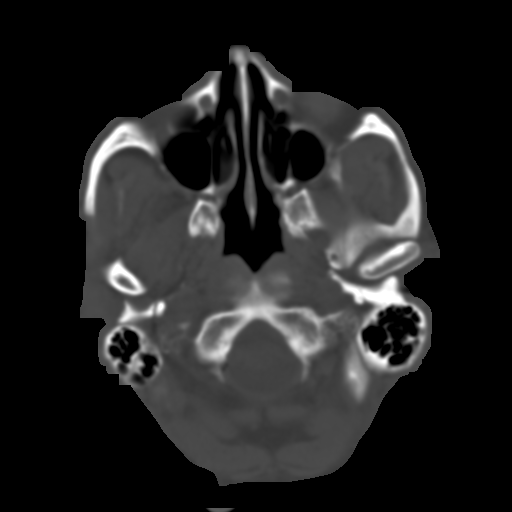
[im 4/29  brain]
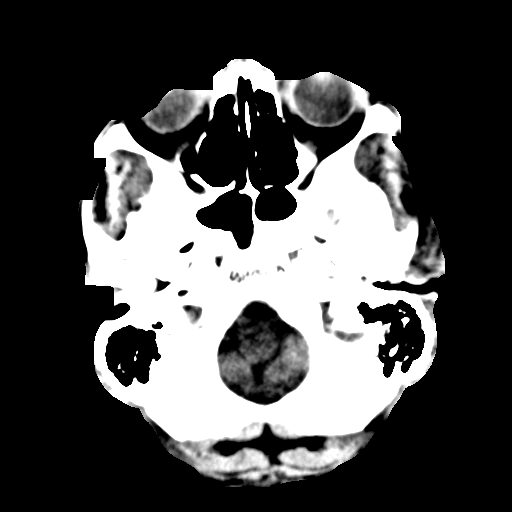
[im 6/29  brain]
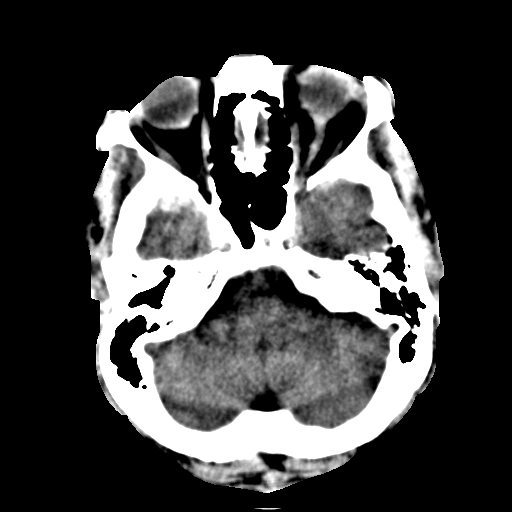
[im 8/29  brain]
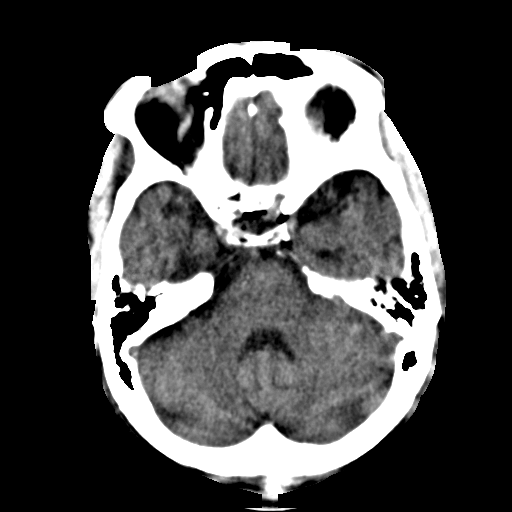
[im 10/29  brain]
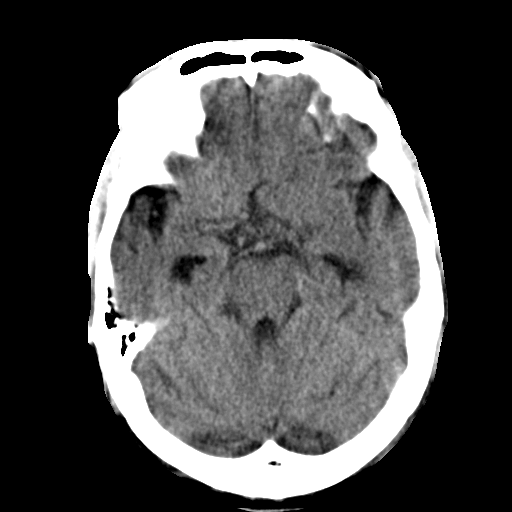
[im 10/29  bone]
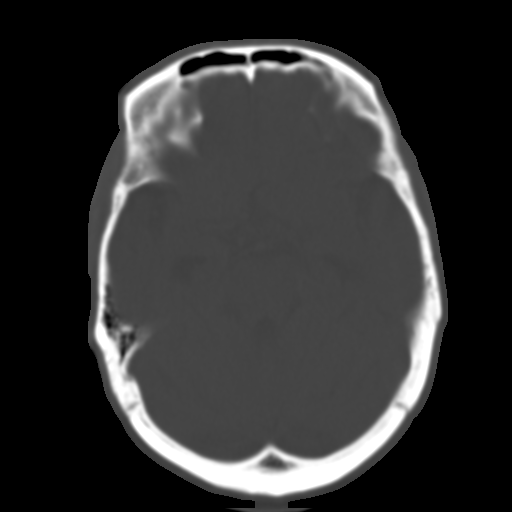
[im 11/29  brain]
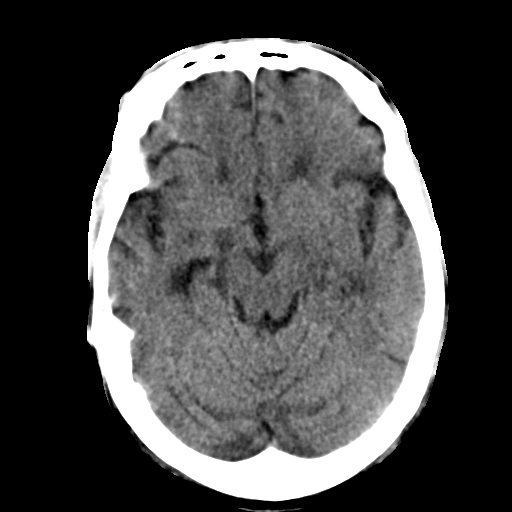
[im 13/29  brain]
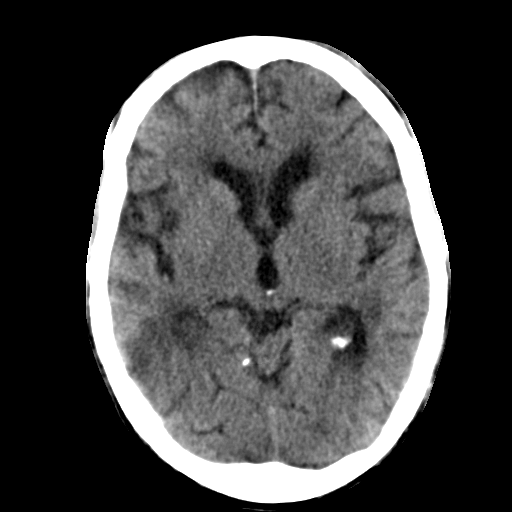
[im 15/29  brain]
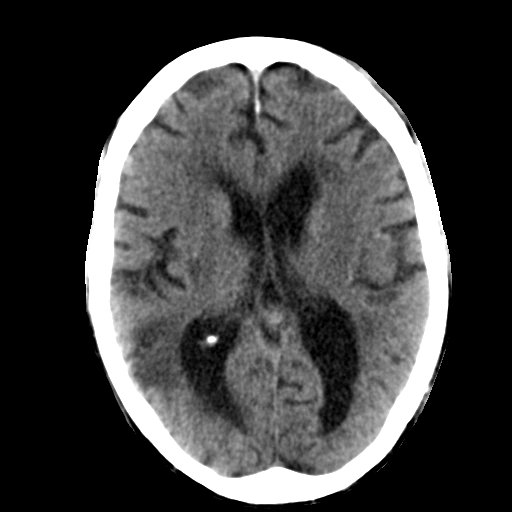
[im 17/29  brain]
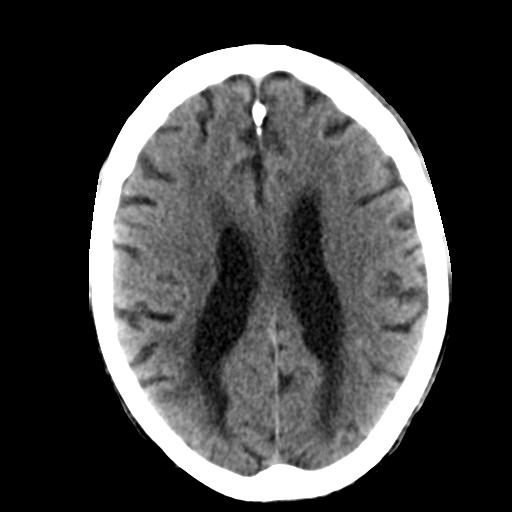
[im 17/29  bone]
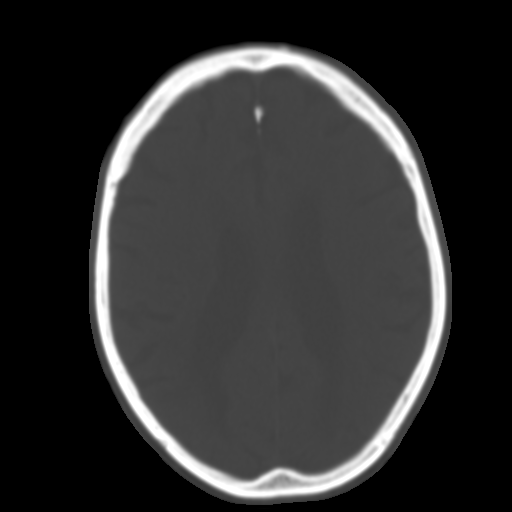
[im 19/29  brain]
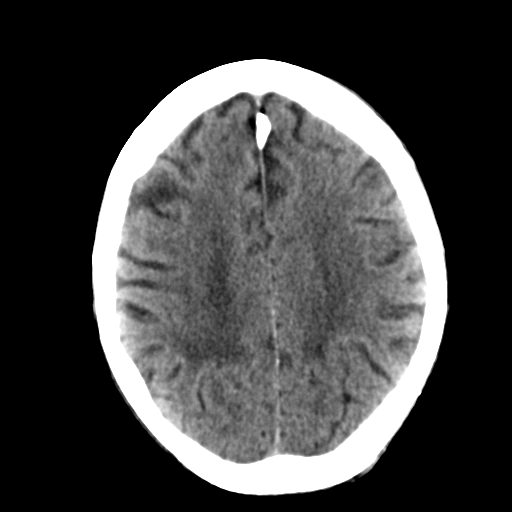
[im 20/29  brain]
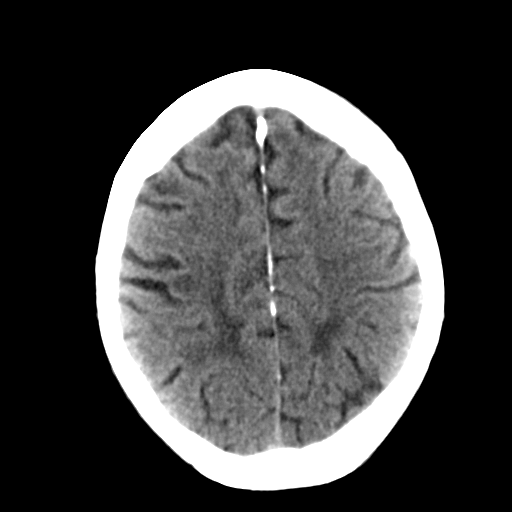
[im 22/29  brain]
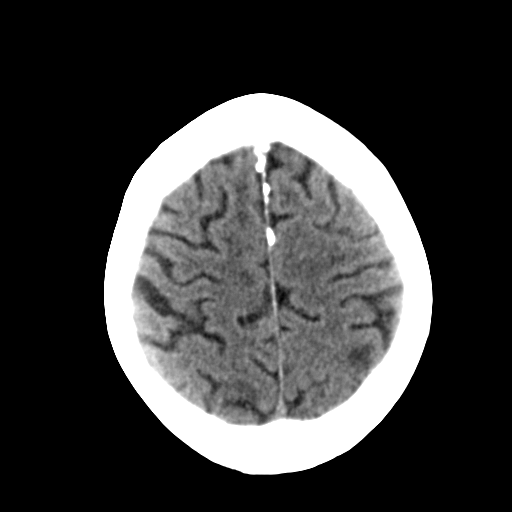
[im 24/29  brain]
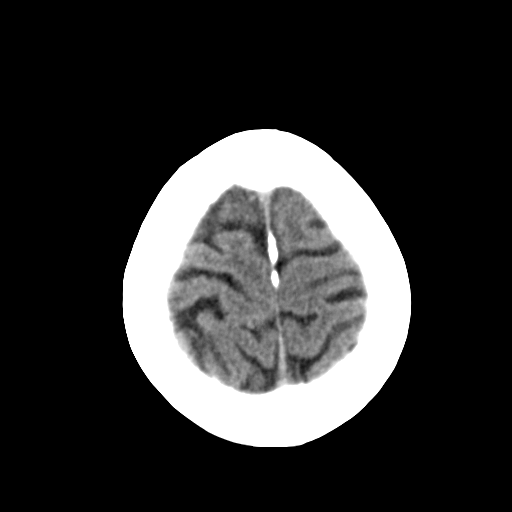
[im 24/29  bone]
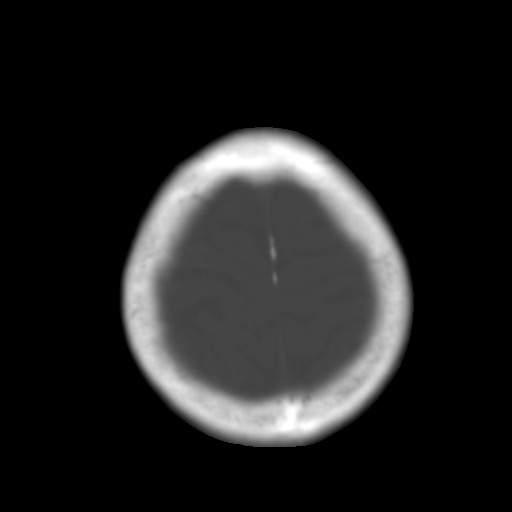
[im 26/29  brain]
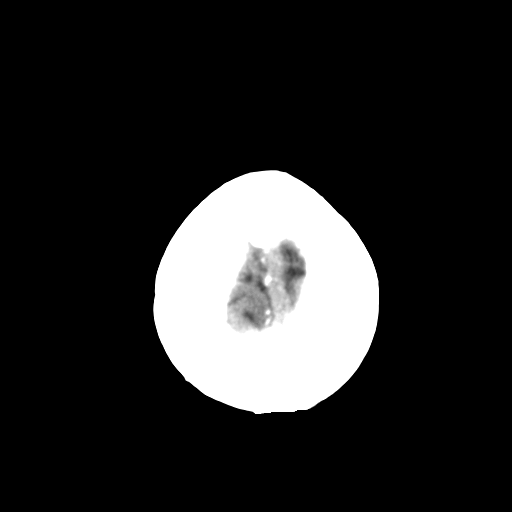
[im 28/29  brain]
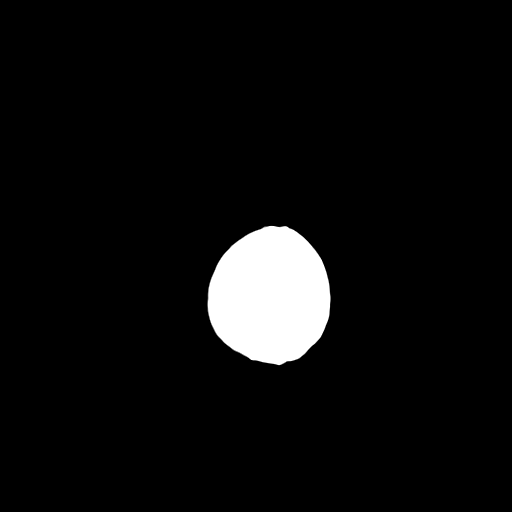

[15 of 29 positions shown; findings below may reference images not displayed]

FINDINGS: There is a well-defined area of hypoattenuation in the posterior
right temporal lobe measuring approximate 2.6 x 2.0 cm in greatest
transverse dimensions. This involves the gray-white junction
consistent with a subacute infarct.

There is no other evidence of a recent infarct. There are patchy
areas of white matter hypoattenuation consistent with moderate
chronic microvascular ischemic change.

There are no parenchymal masses or mass effect.

Ventricles are normal in configuration. There is ventricular and
sulcal enlargement reflecting mild to moderate atrophy.

No extra-axial masses or abnormal fluid collections.

There is no intracranial hemorrhage.

Visualized sinuses and mastoid air cells are clear. No skull lesion.
IMPRESSION: 1. Small area of subacute infarction involving the posterior right
temporal lobe. No other evidence of recent infarction.
2. No intracranial hemorrhage.
3. Atrophy and chronic microvascular ischemic change.
